# Patient Record
Sex: Female | Born: 1950 | Race: White | Hispanic: No | Marital: Married | Smoking: Former smoker
Health system: Southern US, Community
[De-identification: ages and names within clinical notes are randomized; demographics above are authoritative.]

## PROBLEM LIST (undated history)

## (undated) DIAGNOSIS — J42 Unspecified chronic bronchitis: Secondary | ICD-10-CM

## (undated) DIAGNOSIS — E079 Disorder of thyroid, unspecified: Secondary | ICD-10-CM

## (undated) DIAGNOSIS — K219 Gastro-esophageal reflux disease without esophagitis: Secondary | ICD-10-CM

## (undated) DIAGNOSIS — E785 Hyperlipidemia, unspecified: Secondary | ICD-10-CM

## (undated) DIAGNOSIS — F32A Depression, unspecified: Secondary | ICD-10-CM

## (undated) HISTORY — DX: Gastro-esophageal reflux disease without esophagitis: K21.9

## (undated) HISTORY — DX: Disorder of thyroid, unspecified: E07.9

## (undated) HISTORY — DX: Hyperlipidemia, unspecified: E78.5

## (undated) HISTORY — DX: Unspecified chronic bronchitis: J42

## (undated) HISTORY — DX: Depression, unspecified: F32.A

---

## 1998-10-05 ENCOUNTER — Other Ambulatory Visit: Admission: RE | Admit: 1998-10-05 | Discharge: 1998-10-05 | Payer: Self-pay | Admitting: Obstetrics and Gynecology

## 1999-11-30 ENCOUNTER — Other Ambulatory Visit: Admission: RE | Admit: 1999-11-30 | Discharge: 1999-11-30 | Payer: Self-pay | Admitting: Obstetrics and Gynecology

## 2000-12-19 ENCOUNTER — Other Ambulatory Visit: Admission: RE | Admit: 2000-12-19 | Discharge: 2000-12-19 | Payer: Self-pay | Admitting: Obstetrics and Gynecology

## 2002-01-27 ENCOUNTER — Other Ambulatory Visit: Admission: RE | Admit: 2002-01-27 | Discharge: 2002-01-27 | Payer: Self-pay | Admitting: Obstetrics and Gynecology

## 2003-03-23 ENCOUNTER — Other Ambulatory Visit: Admission: RE | Admit: 2003-03-23 | Discharge: 2003-03-23 | Payer: Self-pay | Admitting: Obstetrics and Gynecology

## 2006-01-09 ENCOUNTER — Encounter: Admission: RE | Admit: 2006-01-09 | Discharge: 2006-01-09 | Payer: Self-pay | Admitting: Family Medicine

## 2008-12-22 ENCOUNTER — Emergency Department (HOSPITAL_COMMUNITY): Admission: EM | Admit: 2008-12-22 | Discharge: 2008-12-22 | Payer: Self-pay | Admitting: Emergency Medicine

## 2010-05-07 ENCOUNTER — Ambulatory Visit
Admission: RE | Admit: 2010-05-07 | Discharge: 2010-05-07 | Disposition: A | Discharge status: Home / Self Care | Payer: 59 | Source: Ambulatory Visit | Attending: Family Medicine | Admitting: Family Medicine

## 2010-05-07 ENCOUNTER — Other Ambulatory Visit: Payer: Self-pay | Admitting: Family Medicine

## 2010-05-07 DIAGNOSIS — R0602 Shortness of breath: Secondary | ICD-10-CM

## 2010-05-16 ENCOUNTER — Encounter: Payer: Self-pay | Admitting: Internal Medicine

## 2010-05-16 ENCOUNTER — Encounter (INDEPENDENT_AMBULATORY_CARE_PROVIDER_SITE_OTHER): Payer: 59

## 2010-05-16 DIAGNOSIS — J449 Chronic obstructive pulmonary disease, unspecified: Secondary | ICD-10-CM

## 2010-05-18 ENCOUNTER — Encounter: Payer: Self-pay | Admitting: Internal Medicine

## 2010-05-24 NOTE — Assessment & Plan Note (Signed)
Summary: PFT FOR COPD//SH    Other Orders: Carbon Monoxide diffusing w/capacity (04540) Lung Volumes/Gas dilution or washout (98119) Spirometry (Pre & Post) [phone])

## 2012-04-10 ENCOUNTER — Telehealth: Payer: Self-pay | Admitting: *Deleted

## 2016-03-12 ENCOUNTER — Other Ambulatory Visit (HOSPITAL_COMMUNITY)
Admission: RE | Admit: 2016-03-12 | Discharge: 2016-03-12 | Disposition: A | Discharge status: Home / Self Care | Payer: 59 | Source: Ambulatory Visit | Attending: Family Medicine | Admitting: Family Medicine

## 2016-03-12 ENCOUNTER — Other Ambulatory Visit: Payer: Self-pay | Admitting: Family Medicine

## 2016-03-12 DIAGNOSIS — Z1151 Encounter for screening for human papillomavirus (HPV): Secondary | ICD-10-CM | POA: Diagnosis not present

## 2016-03-12 DIAGNOSIS — Z01411 Encounter for gynecological examination (general) (routine) with abnormal findings: Secondary | ICD-10-CM | POA: Insufficient documentation

## 2016-03-14 LAB — CYTOLOGY - PAP
Diagnosis: NEGATIVE
HPV: NOT DETECTED

## 2016-06-13 ENCOUNTER — Other Ambulatory Visit: Payer: Self-pay | Admitting: Family Medicine

## 2016-06-13 DIAGNOSIS — M5431 Sciatica, right side: Secondary | ICD-10-CM

## 2016-07-03 ENCOUNTER — Ambulatory Visit
Admission: RE | Admit: 2016-07-03 | Discharge: 2016-07-03 | Disposition: A | Discharge status: Home / Self Care | Payer: 59 | Source: Ambulatory Visit | Attending: Family Medicine | Admitting: Family Medicine

## 2016-07-03 DIAGNOSIS — M5431 Sciatica, right side: Secondary | ICD-10-CM

## 2017-04-02 ENCOUNTER — Other Ambulatory Visit: Payer: Self-pay | Admitting: Family Medicine

## 2017-04-02 ENCOUNTER — Ambulatory Visit
Admission: RE | Admit: 2017-04-02 | Discharge: 2017-04-02 | Disposition: A | Discharge status: Home / Self Care | Payer: 59 | Source: Ambulatory Visit | Attending: Family Medicine | Admitting: Family Medicine

## 2017-04-02 DIAGNOSIS — R0602 Shortness of breath: Secondary | ICD-10-CM

## 2017-04-23 ENCOUNTER — Other Ambulatory Visit: Payer: Self-pay | Admitting: Family Medicine

## 2017-04-23 DIAGNOSIS — R9389 Abnormal findings on diagnostic imaging of other specified body structures: Secondary | ICD-10-CM

## 2017-04-25 ENCOUNTER — Ambulatory Visit
Admission: RE | Admit: 2017-04-25 | Discharge: 2017-04-25 | Disposition: A | Discharge status: Home / Self Care | Payer: 59 | Source: Ambulatory Visit | Attending: Family Medicine | Admitting: Family Medicine

## 2017-04-25 DIAGNOSIS — R9389 Abnormal findings on diagnostic imaging of other specified body structures: Secondary | ICD-10-CM

## 2017-04-25 MED ORDER — IOPAMIDOL (ISOVUE-300) INJECTION 61%
75.0000 mL | Freq: Once | INTRAVENOUS | Status: AC | PRN
  Administered 2017-04-25: 75 mL via INTRAVENOUS

## 2017-05-15 ENCOUNTER — Encounter: Payer: Self-pay | Admitting: Internal Medicine

## 2017-05-15 ENCOUNTER — Ambulatory Visit (INDEPENDENT_AMBULATORY_CARE_PROVIDER_SITE_OTHER): Payer: 59 | Admitting: Internal Medicine

## 2017-05-15 VITALS — BP 118/74 | HR 86 | Ht 65.0 in | Wt 117.8 lb

## 2017-05-15 DIAGNOSIS — J449 Chronic obstructive pulmonary disease, unspecified: Secondary | ICD-10-CM

## 2017-05-15 DIAGNOSIS — R918 Other nonspecific abnormal finding of lung field: Secondary | ICD-10-CM | POA: Diagnosis not present

## 2017-05-15 DIAGNOSIS — J9819 Other pulmonary collapse: Secondary | ICD-10-CM | POA: Insufficient documentation

## 2017-05-15 NOTE — Progress Notes (Signed)
Subjective:    Patient ID: Jeanette Rivera, female    DOB: 04/07/1950,    MRN: [id number]  HPI  45 yowf quit smoking 03/2017 with tendency for uri's to turn into bronchitis typically each winter with onset of typical spell late Dec 2018 > abn cxr 04/02/17 then 2 rounds of abx with resolution of symptoms but persistent changes on CT 04/25/17 so referred to pulmonary clinic 05/15/2017 by Dr  Mila Palmer.    05/15/2017 1st [facility] office visit/ Wert   Chief Complaint  Patient presents with  . Pulmonary Consult    Dr. Paulino Rily referred for pneumonia, no SOb or cough at this time, on Symbicort and doesnt use rescue inhaler much, hoarseness   At present  Not limited by breathing from desired activities  / no need for  inhalers/ no pfts on record No cough   No obvious day to day or daytime variability or assoc excess/ purulent sputum or mucus plugs or hemoptysis or cp or chest tightness, subjective wheeze or overt sinus or hb symptoms. No unusual exposure hx or h/o childhood pna/ asthma or knowledge of premature birth.  Sleeping ok flat without nocturnal  or early am exacerbation  of respiratory  c/o's or need for noct saba. Also denies any obvious fluctuation of symptoms with weather or environmental changes or other aggravating or alleviating factors except as outlined above   Current Allergies, Complete Past Medical History, Past Surgical History, Family History, and Social History were reviewed in [employer] record.     Current Meds  Medication Sig  . albuterol (PROVENTIL HFA;VENTOLIN HFA) 108 (90 Base) MCG/ACT inhaler Inhale into the lungs every 6 (six) hours as needed for wheezing or shortness of breath.  . ALPRAZolam (XANAX) 0.25 MG tablet Take 0.25 mg by mouth 2 (two) times daily as needed for anxiety.  . budesonide-formoterol (SYMBICORT) 80-4.5 MCG/ACT inhaler Inhale 2 puffs into the lungs 2 (two) times daily.  . Multiple Vitamin (MULTIVITAMIN) tablet  Take 1 tablet by mouth daily.  Marland Kitchen omeprazole (PRILOSEC) 40 MG capsule Take 40 mg by mouth daily.            Review of Systems  Constitutional: Positive for unexpected weight change. Negative for fever.  HENT: Negative for congestion, dental problem, ear pain, nosebleeds, postnasal drip, rhinorrhea, sinus pressure, sneezing, sore throat and trouble swallowing.   Eyes: Negative for redness and itching.  Respiratory: Positive for cough. Negative for chest tightness, shortness of breath and wheezing.   Cardiovascular: Negative for palpitations and leg swelling.  Gastrointestinal: Negative for nausea and vomiting.  Genitourinary: Negative for dysuria.  Musculoskeletal: Negative for joint swelling.  Skin: Negative for rash.  Neurological: Negative for headaches.  Hematological: Does not bruise/bleed easily.  Psychiatric/Behavioral: Negative for dysphoric mood. The patient is not nervous/anxious.        Objective:   Physical Exam  amb wf nad  Wt Readings from Last 3 Encounters:  05/15/17 117 lb 12.8 oz (53.4 kg)     Vital signs reviewed - Note on arrival 02 sats  98% on   RA   HEENT: nl dentition, turbinates bilaterally, and oropharynx. Nl external ear canals without cough reflex   NECK :  without JVD/Nodes/TM/ nl carotid upstrokes bilaterally   LUNGS: no acc muscle use,  Nl contour chest which is clear to A and P bilaterally without cough on insp or exp maneuvers   CV:  RRR  no s3 or murmur or increase in P2, and  no edema   ABD:  soft and nontender with nl inspiratory excursion in the supine position. No bruits or organomegaly appreciated, bowel sounds nl  MS:  Nl gait/ ext warm without deformities, calf tenderness, cyanosis or clubbing No obvious joint restrictions   SKIN: warm and dry without lesions    NEURO:  alert, approp, nl sensorium with  no motor or cerebellar deficits apparent.     I personally reviewed images and agree with radiology impression as  follows:   Chest CT 04/25/17  1. Persistent irregular opacity in portions of the inferior aspect of the right middle lobe medially. While some or potentially all of this area may represent scarring, the appearance is concerning for a degree of airspace consolidation/pneumonia in this area. No cavitation is noted in this area. This area warrants either additional 3-4 week radiographic surveillance with consideration for bronchoscopy if there is no change in this area or bronchoscopy sooner if there is high clinical suspicion for pneumonia in this area. No obstructing focus is noted in peripheral bronchial structures in this area.  2. Underlying centrilobular emphysema with scattered areas of scarring. Several nodular opacities noted, largest measuring 5 mm. No follow-up needed if patient is low-risk (and has no known or suspected primary neoplasm). Non-contrast chest CT can be considered in 12 months if patient is high-risk.      Assessment & Plan:

## 2017-05-15 NOTE — Assessment & Plan Note (Addendum)
Quit smoking 03/2017 > needs baseline pfts at f/u ov    I reviewed the Fletcher curve with the patient that basically indicates  if you quit smoking when your best day FEV1 is still well preserved (as is clearly  the case here)  it is highly unlikely you will progress to severe disease and informed the patient there was  no medication on the market that has proven to alter the curve/ its downward trajectory  or the likelihood of progression of their disease(unlike other chronic medical conditions such as atheroclerosis where we do think we can change the natural hx with risk reducing meds)    Therefore stopping smoking and maintaining abstinence are  the most important aspects of care, not choice of inhalers or for that matter, doctors.   Treatment other than smoking cessation  is entirely directed by severity of symptoms and focused also on reducing exacerbations, not attempting to change the natural history of the disease.    As having no more symptoms needs no rx for now

## 2017-05-15 NOTE — Assessment & Plan Note (Signed)
See study 04/25/17  Largest 5 mm > placed in tickle file for recall 04/25/18  As she is at risk (quit smoking 03/2017)   CT results reviewed with pt >>> Too small for PET or bx, not suspicious enough for excisional bx > really only option for now is follow the Fleischner society guidelines as rec by radiology.    Discussed in detail all the  indications, usual  risks and alternatives  relative to the benefits with patient who agrees to proceed with conservative f/u as outlined

## 2017-05-15 NOTE — Patient Instructions (Addendum)
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

## 2017-05-15 NOTE — Assessment & Plan Note (Signed)
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 because of the poor collateral aeration of the RML wedged between the upper and lower with a fish mouth orifice that is prone to mucus plugging esp in pts with poor mc function from smoking  Other than maintaining cig abstinence, all she needs is f/u cxr in 4-6 weeks as the abn is easily seen on lateral cxr and the 5 mm mpns can be addressed otherwise (see separate a/p)    Total time devoted to counseling  > 50 % of initial 60 min office visit:  review case with pt/ discussion of options/alternatives/ personally creating written customized instructions  in presence of pt  then going over those specific  Instructions directly with the pt including how to use all of the meds but in particular covering each new medication in detail and the difference between the maintenance= "automatic" meds and the prns using an action plan format for the latter (If this problem/symptom => do that organization reading Left to right).  Please see AVS from this visit for a full list of these instructions which I personally wrote for this pt and  are unique to this visit.

## 2017-06-27 ENCOUNTER — Ambulatory Visit (INDEPENDENT_AMBULATORY_CARE_PROVIDER_SITE_OTHER): Payer: 59 | Admitting: Internal Medicine

## 2017-06-27 ENCOUNTER — Ambulatory Visit (INDEPENDENT_AMBULATORY_CARE_PROVIDER_SITE_OTHER)
Admission: RE | Admit: 2017-06-27 | Discharge: 2017-06-27 | Disposition: A | Discharge status: Home / Self Care | Payer: 59 | Source: Ambulatory Visit | Attending: Internal Medicine | Admitting: Internal Medicine

## 2017-06-27 ENCOUNTER — Encounter: Payer: Self-pay | Admitting: Internal Medicine

## 2017-06-27 VITALS — BP 106/70 | HR 68 | Ht 66.0 in | Wt 126.0 lb

## 2017-06-27 DIAGNOSIS — J449 Chronic obstructive pulmonary disease, unspecified: Secondary | ICD-10-CM | POA: Diagnosis not present

## 2017-06-27 DIAGNOSIS — J9819 Other pulmonary collapse: Secondary | ICD-10-CM

## 2017-06-27 LAB — PULMONARY FUNCTION TEST
DL/VA % PRED: 58 %
DL/VA: 2.94 ml/min/mmHg/L
DLCO UNC % PRED: 48 %
DLCO UNC: 13.19 ml/min/mmHg
FEF 25-75 POST: 0.85 L/s
FEF 25-75 PRE: 0.64 L/s
FEF2575-%CHANGE-POST: 31 %
FEF2575-%PRED-POST: 38 %
FEF2575-%PRED-PRE: 29 %
FEV1-%Change-Post: 10 %
FEV1-%Pred-Post: 61 %
FEV1-%Pred-Pre: 55 %
FEV1-POST: 1.57 L
FEV1-Pre: 1.43 L
FEV1FVC-%CHANGE-POST: 6 %
FEV1FVC-%PRED-PRE: 71 %
FEV6-%CHANGE-POST: 4 %
FEV6-%PRED-POST: 82 %
FEV6-%Pred-Pre: 78 %
FEV6-PRE: 2.55 L
FEV6-Post: 2.65 L
FEV6FVC-%CHANGE-POST: 0 %
FEV6FVC-%PRED-PRE: 102 %
FEV6FVC-%Pred-Post: 103 %
FVC-%CHANGE-POST: 3 %
FVC-%PRED-POST: 79 %
FVC-%Pred-Pre: 76 %
FVC-Post: 2.67 L
FVC-Pre: 2.58 L
POST FEV1/FVC RATIO: 59 %
PRE FEV1/FVC RATIO: 55 %
Post FEV6/FVC ratio: 99 %
Pre FEV6/FVC Ratio: 99 %
RV % pred: 107 %
RV: 2.37 L
TLC % pred: 98 %
TLC: 5.27 L

## 2017-06-27 MED ORDER — CLOTRIMAZOLE 10 MG MT TROC: 10.0000 mg | Freq: Every day | OROMUCOSAL | 2 refills | Status: DC

## 2017-06-27 MED ORDER — BUDESONIDE-FORMOTEROL FUMARATE 80-4.5 MCG/ACT IN AERO: INHALATION_SPRAY | RESPIRATORY_TRACT | 11 refills | Status: AC

## 2017-06-27 NOTE — Assessment & Plan Note (Signed)
-   PFT's   05/16/10  FEV1 1.77 (72 % ) ratio 55  p 10 % improvement from saba p ? prior to study with DLCO  61 % corrects to 80 % for alv volume   -Quit smoking 03/2017  - PFT's  06/27/2017  FEV1 1.57 (61 % ) ratio 59  p 10 % improvement from saba p sym 160 prior to study with DLCO  48 % corrects to 58 % for alv volume    Has developed thrush on the 160 and doing great so rec try the 80 2bid    I reviewed the Fletcher curve with the patient that basically indicates  if you quit smoking when your best day FEV1 is still relatively well preserved (as is still the case here)  it is highly unlikely you will progress to severe disease and informed the patient there was  no medication on the market that has proven to alter the curve/ its downward trajectory  or the likelihood of progression of their disease(unlike other chronic medical conditions such as atheroclerosis where we do think we can change the natural hx with risk reducing meds)    Therefore stopping smoking and maintaining abstinence are  the most important aspects of care, not choice of inhalers or for that matter, doctors.   Treatment other than smoking cessation  is entirely directed by severity of symptoms and focused also on reducing exacerbations, not attempting to change the natural history of the disease.   As long as not having exac or limiting symptoms will try to maintain on lower dose of symb and f/u in 3 months    I had an extended discussion with the patient reviewing all relevant studies completed to date and  lasting 15 to 20 minutes of a 25 minute visit    Each maintenance medication was reviewed in detail including most importantly the difference between maintenance and prns and under what circumstances the prns are to be triggered using an action plan format that is not reflected in the computer generated alphabetically organized AVS.    Please see AVS for specific instructions unique to this visit that I personally wrote and  verbalized to the the pt in detail and then reviewed with pt  by my nurse highlighting any  changes in therapy recommended at today's visit to their plan of care.

## 2017-06-27 NOTE — Progress Notes (Signed)
Patient completed full PFT today. 

## 2017-06-27 NOTE — Progress Notes (Signed)
Left detailed msg with results

## 2017-06-27 NOTE — Progress Notes (Signed)
Subjective:   Patient ID: Jeanette Rivera, female    DOB: 09-17-1950  MRN: [id number]    Brief patient profile:  [age] yowf quit smoking 03/2017 with tendency for uri's to turn into bronchitis typically each winter with onset of typical spell late Dec 2018 > abn cxr 04/02/17 then 2 rounds of abx with resolution of symptoms but persistent changes on CT 04/25/17 so referred to pulmonary clinic 05/15/2017 by Dr  Mila PalmerSharon Wolters with GOLD II criteria 06/27/2017      History of Present Illness  05/15/2017 1st [facility] office visit/ Majorie Santee   Chief Complaint  Patient presents with  . Pulmonary Consult    Dr. 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 Follow-up    PFT's done today. She states voice hoarseness comes and goes. She has not had to use her albuterol inhaler.    doe = Not limited by breathing from desired activities   Cough > none  Sleep: ok SABA use:  No need for rescue    No obvious day to day or daytime variability or assoc excess/ purulent sputum or mucus plugs or hemoptysis or cp or chest tightness, subjective wheeze or overt sinus or hb symptoms. No unusual exposure hx or h/o childhood pna/ asthma or knowledge of premature birth.  Sleeping  Ok flat  without nocturnal  or early am exacerbation  of respiratory  c/o's or need for noct saba. Also denies any obvious fluctuation of symptoms with weather or environmental changes or other aggravating or alleviating factors except as outlined  above   Current Allergies, Complete Past Medical History, Past Surgical History, Family History, and Social History were reviewed in [facility] electronic [facility] record.  ROS  The following are not active complaints unless bolded Hoarseness, sore throat, dysphagia, dental problems, itching, sneezing,  nasal congestion or discharge of excess mucus or purulent secretions, ear ache,   fever, chills, sweats, unintended wt loss or wt gain, classically pleuritic or exertional cp,  orthopnea pnd or arm/hand swelling  or leg swelling, presyncope, palpitations, abdominal pain, anorexia, nausea, vomiting, diarrhea  or change in bowel habits or change in bladder habits, change in stools or change in urine, dysuria, hematuria,  rash, arthralgias, visual complaints, headache, numbness, weakness or ataxia or problems with walking or coordination,  change in mood or  memory.        Current Meds  Medication Sig  . albuterol (PROVENTIL HFA;VENTOLIN HFA) 108 (90 Base) MCG/ACT inhaler Inhale into the lungs every 6 (six) hours as needed for wheezing or shortness of breath.  . ALPRAZolam (XANAX) 0.25 MG tablet Take 0.25 mg by mouth 2 (two) times daily as needed for anxiety.  . budesonide-formoterol (SYMBICORT) 80-4.5 MCG/ACT inhaler Inhale 2 puffs into the lungs 2 (two) times daily.  . Multiple Vitamin (MULTIVITAMIN) tablet Take 1 tablet by mouth daily.  Marland Kitchen. omeprazole (PRILOSEC) 40 MG capsule Take 40 mg by mouth daily.  Objective:   Physical Exam  amb wf nad  Wt Readings from Last 3 Encounters:  05/15/17 117 lb 12.8 oz (53.4 kg)     Vital signs reviewed - Note on arrival 02 sats  97% on RA       HEENT: nl dentition, turbinates bilaterally, and oropharynx. Nl external ear canals without cough reflex   NECK :  without JVD/Nodes/TM/ nl carotid upstrokes bilaterally   LUNGS: no acc muscle use,  Nl contour chest which is clear to A and P bilaterally without cough on insp or exp  maneuvers   CV:  RRR  no s3 or murmur or increase in P2, and no edema   ABD:  soft and nontender with nl inspiratory excursion in the supine position. No bruits or organomegaly appreciated, bowel sounds nl  MS:  Nl gait/ ext warm without deformities, calf tenderness, cyanosis or clubbing No obvious joint restrictions   SKIN: warm and dry without lesions    NEURO:  alert, approp, nl sensorium with  no motor or cerebellar deficits apparent.        CXR PA and Lateral:   06/27/2017 :    I personally reviewed images and agree with radiology impression as follows:   Interval clearing of right base infiltrate.      Assessment & Plan:

## 2017-06-27 NOTE — Patient Instructions (Addendum)
Clotrimazole troche up to 5 x daily   Try reduce the symbicort  80 Take 2 puffs first thing in am and then another 2 puffs about 12 hours later.   Work on inhaler technique:  relax and gently blow all the way out then take a nice smooth deep breath back in, triggering the inhaler at same time you start breathing in.  Hold for up to 5 seconds if you can. Blow out thru nose. Rinse and gargle with water when done  Please remember to go to the  x-ray department downstairs in the basement  for your tests - we will call you with the results when they are available.       Please schedule a follow up visit in 3 months but call sooner if needed

## 2017-08-28 ENCOUNTER — Encounter: Payer: Self-pay | Admitting: Internal Medicine

## 2017-08-28 NOTE — Assessment & Plan Note (Signed)
cxr reviewed and is clear

## 2017-09-26 ENCOUNTER — Encounter: Payer: Self-pay | Admitting: Internal Medicine

## 2017-09-26 ENCOUNTER — Ambulatory Visit (INDEPENDENT_AMBULATORY_CARE_PROVIDER_SITE_OTHER): Payer: 59 | Admitting: Internal Medicine

## 2017-09-26 VITALS — BP 114/76 | HR 76 | Ht 66.0 in | Wt 140.8 lb

## 2017-09-26 DIAGNOSIS — R918 Other nonspecific abnormal finding of lung field: Secondary | ICD-10-CM | POA: Diagnosis not present

## 2017-09-26 DIAGNOSIS — J449 Chronic obstructive pulmonary disease, unspecified: Secondary | ICD-10-CM | POA: Diagnosis not present

## 2017-09-26 NOTE — Progress Notes (Signed)
Subjective:   Patient ID: Jeanette Rivera, female    DOB: Dec 14, 1950  MRN: [id number]    Brief patient profile:  83 yowf quit smoking 03/2017 with tendency for uri's to turn into bronchitis typically each winter with onset of typical spell late Dec 2018 > abn cxr 04/02/17 then 2 rounds of abx with resolution of symptoms but persistent changes on CT 04/25/17 so referred to pulmonary clinic 05/15/2017 by Dr  Mila Palmer with GOLD II criteria 06/27/2017      History of Present Illness  05/15/2017 1st [facility] office visit/ Briceyda Abdullah   Chief Complaint  Patient presents with  . Pulmonary Consult    Dr. 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 Follow-up    PFT's done today. She states voice hoarseness comes and goes. She has not had to use her albuterol inhaler.    doe = Not limited by breathing from desired activities   Cough > none  Sleep: ok SABA use:  No need for rescue  rec Clotrimazole troche up to 5 x daily  Try reduce the symbicort  80 Take 2 puffs first thing in am and then another 2 puffs about 12 hours later.  Work on inhaler technique:      09/26/2017  f/u ov/Braelynne Garinger re:  GOLD II copd/ symb 80 2bid  Chief Complaint  Patient presents with  . Follow-up    Breathing is unchanged. She is coughing less. She states has not had to use her albuterol inhaler. She states currently on pred and amoxicllin per ENT for fluid in her left ear.    acute sore throat/ chest tight no cough end of  June 2019 while in [place]  Rx zpak then amoxic/prednisone starting 09/23/17  Dyspnea:  Not limited by breathing from desired activities   Cough: much better    SABA use: none while sym 80 2bid 02: none    No obvious day to day or daytime variability or assoc excess/ purulent sputum or mucus plugs or hemoptysis or cp or chest tightness, subjective wheeze or overt sinus or hb symptoms.   Sleeping: fine flat without nocturnal  or early am exacerbation  of respiratory  c/o's or need for noct saba. Also denies any obvious fluctuation of symptoms with weather or environmental changes or other aggravating or alleviating factors except as outlined above   No unusual exposure hx or h/o childhood pna/ asthma or knowledge of premature birth.  Current Allergies, Complete Past Medical History, Past Surgical History, Family History, and Social History were reviewed in [employer] record.  ROS  The following are not active complaints unless bolded Hoarseness, sore throat, dysphagia, dental problems, itching, sneezing,  nasal congestion or discharge of excess mucus or purulent secretions, ear ache,   fever, chills, sweats, unintended wt loss or wt gain, classically pleuritic or exertional cp,  orthopnea pnd or arm/hand swelling  or leg swelling, presyncope, palpitations, abdominal pain, anorexia,  nausea, vomiting, diarrhea  or change in bowel habits or change in bladder habits, change in stools or change in urine, dysuria, hematuria,  rash, arthralgias, visual complaints, headache, numbness, weakness or ataxia or problems with walking or coordination,  change in mood or  memory.        Current Meds  Medication Sig  . albuterol (PROVENTIL HFA;VENTOLIN HFA) 108 (90 Base) MCG/ACT inhaler Inhale into the lungs every 6 (six) hours as needed for wheezing or shortness of breath.  . ALPRAZolam (XANAX) 0.25 MG tablet Take 0.25 mg by mouth 2 (two) times daily as needed for anxiety.  Marland Kitchen.  amoxicillin-clavulanate (AUGMENTIN) 875-125 MG tablet Take 1 tablet by mouth 2 (two) times daily.  . benzonatate (TESSALON) 200 MG capsule Take 1 capsule by mouth 3 (three) times daily as needed.  . budesonide-formoterol (SYMBICORT) 80-4.5 MCG/ACT inhaler Inhale 2 puffs into the lungs 2 (two) times daily.  . budesonide-formoterol (SYMBICORT) 80-4.5 MCG/ACT inhaler Take 2 puffs first thing in am and then another 2 puffs about 12 hours later.  . Multiple Vitamin (MULTIVITAMIN) tablet Take 1 tablet by mouth daily.  Marland Kitchen. omeprazole (PRILOSEC) 40 MG capsule Take 40 mg by mouth daily.  . predniSONE (STERAPRED UNI-PAK 21 TAB) 10 MG (21) TBPK tablet Taper as directed                    Objective:   Physical Exam  amb wf nad    Wt Readings from Last 3 Encounters:  09/26/17 140 lb 12.8 oz (63.9 kg)  06/27/17 126 lb (57.2 kg)  05/15/17 117 lb 12.8 oz (53.4 kg)     Vital signs reviewed - Note on arrival 02 sats  93% on RA   HEENT: nl dentition, turbinates bilaterally  Nl external ear canals without cough reflex Oropharynx ok x    slt thrush posterior soft palate / uvula    NECK :  without JVD/Nodes/TM/ nl carotid upstrokes bilaterally   LUNGS: no acc muscle use,  Nl contour chest which is clear to A and P bilaterally without cough on insp or exp maneuvers   CV:  RRR  no s3 or murmur or increase in P2, and no edema   ABD:  soft and nontender with nl inspiratory excursion in the supine position. No bruits or organomegaly appreciated, bowel sounds nl  MS:  Nl gait/ ext warm without deformities, calf tenderness, cyanosis or clubbing No obvious joint restrictions   SKIN: warm and dry without lesions    NEURO:  alert, approp, nl sensorium with  no motor or cerebellar deficits apparent.                  Assessment & Plan:

## 2017-09-26 NOTE — Patient Instructions (Signed)
Change symbicort to where you take it up to 2 puffs every 12 hours as needed for short of breath/ cough/ tightness   If mouth /throat bothers you [facility]    Please schedule a follow up visit in 6 months but call sooner if needed

## 2017-09-27 ENCOUNTER — Encounter: Payer: Self-pay | Admitting: Internal Medicine

## 2017-09-27 NOTE — Assessment & Plan Note (Signed)
See study 04/25/17  Largest 5 mm > placed in tickle file for recall 04/25/18  As she is at risk (quit smoking 03/2017)  Reminded we have her in reminder file for recall CT - call sooner if any new symptoms develop

## 2017-09-27 NOTE — Assessment & Plan Note (Signed)
-   PFT's   05/16/10  FEV1 1.77 (72 % ) ratio 55  p 10 % improvement from saba p ? prior to study with DLCO  61 % corrects to 80 % for alv volume   -Quit smoking 03/2017  - PFT's  06/27/2017  FEV1 1.57 (61 % ) ratio 59  p 10 % improvement from saba p sym 160 prior to study with DLCO  48 % corrects to 58 % for alv volume    - 09/26/2017  After extensive coaching inhaler device  effectiveness =    90% from a baseline of 75%   Despite suboptimal hfa and half the usual strength of symbicort she's doing great and if continues to be bothered by thrush options are:  1) change symb 80 to 2 bid prn as per mild asthma recs Based on two studies from NEJM  378; 20 p 1865 (2018) and 380 : p2020-30 (2019) in pts with mild asthma it is reasonable to use low dose symbicort eg 80 2bid "prn" flare in this setting but I emphasized this was only shown with symbicort and takes advantage of the rapid onset of action but is not the same as "rescue therapy" but can be stopped once the acute symptoms have resolved and the need for rescue has been minimized (< 2 x weekly)    2) use spacer  3) change to bevespi 2 bid    She likes symb and wants to try symb 80 2bid prn first    I had an extended discussion with the patient reviewing all relevant studies completed to date and  lasting 15 to 20 minutes of a 25 minute visit    See device teaching which extended face to face time for this visit.  Each maintenance medication was reviewed in detail including emphasizing most importantly the difference between maintenance and prns and under what circumstances the prns are to be triggered using an action plan format that is not reflected in the computer generated alphabetically organized AVS which I have not found useful in most complex patients, especially with respiratory illnesses  Please see AVS for specific instructions unique to this visit that I personally wrote and verbalized to the the pt in detail and then reviewed with pt   by my nurse highlighting any  changes in therapy recommended at today's visit to their plan of care.      F/u can be q 6 m prn

## 2018-03-30 ENCOUNTER — Ambulatory Visit: Payer: 59 | Admitting: Internal Medicine

## 2019-01-28 ENCOUNTER — Other Ambulatory Visit: Payer: Self-pay | Admitting: Family Medicine

## 2019-01-28 DIAGNOSIS — E063 Autoimmune thyroiditis: Secondary | ICD-10-CM

## 2019-02-04 ENCOUNTER — Ambulatory Visit
Admission: RE | Admit: 2019-02-04 | Discharge: 2019-02-04 | Disposition: A | Discharge status: Home / Self Care | Payer: Medicare Other | Source: Ambulatory Visit | Attending: Family Medicine | Admitting: Family Medicine

## 2019-02-04 DIAGNOSIS — E063 Autoimmune thyroiditis: Secondary | ICD-10-CM

## 2019-05-13 ENCOUNTER — Ambulatory Visit: Payer: Medicare Other | Attending: Internal Medicine

## 2019-05-13 DIAGNOSIS — Z23 Encounter for immunization: Secondary | ICD-10-CM | POA: Insufficient documentation

## 2019-05-13 NOTE — Progress Notes (Signed)
   Covid-19 Vaccination Clinic  Name:  Jeanette Rivera    MRN: [id number] DOB: [birth year]  05/13/2019  [name] was observed post Covid-19 immunization for 15 minutes without incidence. She was provided with Vaccine Information Sheet and instruction to access the V-Safe system.   [name] was instructed to call 911 with any severe reactions post vaccine: Marland Kitchen Difficulty breathing  . Swelling of your face and throat  . A fast heartbeat  . A bad rash all over your body  . Dizziness and weakness    Immunizations Administered    Name Date Dose VIS Date Route   Pfizer COVID-19 Vaccine 05/13/2019 12:16 PM 0.3 mL 02/26/2019 Intramuscular   Manufacturer: [employer], [employer]   Lot: [id number]   NDC: [address]-0

## 2019-06-02 ENCOUNTER — Ambulatory Visit: Payer: Medicare Other | Attending: Internal Medicine

## 2019-06-02 DIAGNOSIS — Z23 Encounter for immunization: Secondary | ICD-10-CM

## 2019-06-02 NOTE — Progress Notes (Signed)
   Covid-19 Vaccination Clinic  Name:  Jeanette Rivera    MRN: [id number] DOB: [birth year]  06/02/2019  [name] was observed post Covid-19 immunization for 15 minutes without incident. She was provided with Vaccine Information Sheet and instruction to access the V-Safe system.   [name] was instructed to call 911 with any severe reactions post vaccine: Marland Kitchen Difficulty breathing  . Swelling of face and throat  . A fast heartbeat  . A bad rash all over body  . Dizziness and weakness   Immunizations Administered    Name Date Dose VIS Date Route   Pfizer COVID-19 Vaccine 06/02/2019 12:55 PM 0.3 mL 02/26/2019 Intramuscular   Manufacturer: [employer], [employer]   Lot: [id number]   NDC: [address]-8

## 2020-09-11 DIAGNOSIS — H43392 Other vitreous opacities, left eye: Secondary | ICD-10-CM | POA: Diagnosis not present

## 2020-10-16 DIAGNOSIS — H2513 Age-related nuclear cataract, bilateral: Secondary | ICD-10-CM | POA: Diagnosis not present

## 2020-10-16 DIAGNOSIS — H43812 Vitreous degeneration, left eye: Secondary | ICD-10-CM | POA: Diagnosis not present

## 2020-10-26 DIAGNOSIS — J449 Chronic obstructive pulmonary disease, unspecified: Secondary | ICD-10-CM | POA: Diagnosis not present

## 2020-10-26 DIAGNOSIS — F411 Generalized anxiety disorder: Secondary | ICD-10-CM | POA: Diagnosis not present

## 2020-10-26 DIAGNOSIS — E7800 Pure hypercholesterolemia, unspecified: Secondary | ICD-10-CM | POA: Diagnosis not present

## 2021-01-31 DIAGNOSIS — Z780 Asymptomatic menopausal state: Secondary | ICD-10-CM | POA: Diagnosis not present

## 2021-01-31 DIAGNOSIS — M8589 Other specified disorders of bone density and structure, multiple sites: Secondary | ICD-10-CM | POA: Diagnosis not present

## 2021-01-31 DIAGNOSIS — Z1231 Encounter for screening mammogram for malignant neoplasm of breast: Secondary | ICD-10-CM | POA: Diagnosis not present

## 2021-07-25 DIAGNOSIS — Z79899 Other long term (current) drug therapy: Secondary | ICD-10-CM | POA: Diagnosis not present

## 2021-07-25 DIAGNOSIS — F411 Generalized anxiety disorder: Secondary | ICD-10-CM | POA: Diagnosis not present

## 2021-07-25 DIAGNOSIS — I700 Atherosclerosis of aorta: Secondary | ICD-10-CM | POA: Diagnosis not present

## 2021-07-25 DIAGNOSIS — F330 Major depressive disorder, recurrent, mild: Secondary | ICD-10-CM | POA: Diagnosis not present

## 2021-07-25 DIAGNOSIS — E038 Other specified hypothyroidism: Secondary | ICD-10-CM | POA: Diagnosis not present

## 2021-07-25 DIAGNOSIS — J449 Chronic obstructive pulmonary disease, unspecified: Secondary | ICD-10-CM | POA: Diagnosis not present

## 2021-07-25 DIAGNOSIS — Z0000 Encounter for general adult medical examination without abnormal findings: Secondary | ICD-10-CM | POA: Diagnosis not present

## 2021-07-25 DIAGNOSIS — E7800 Pure hypercholesterolemia, unspecified: Secondary | ICD-10-CM | POA: Diagnosis not present

## 2021-07-25 DIAGNOSIS — E559 Vitamin D deficiency, unspecified: Secondary | ICD-10-CM | POA: Diagnosis not present

## 2021-07-26 DIAGNOSIS — Z1211 Encounter for screening for malignant neoplasm of colon: Secondary | ICD-10-CM | POA: Diagnosis not present

## 2021-08-04 IMAGING — US US THYROID
1 series · 13 of 25 positions shown · non-contrast
Comparison: None.

CLINICAL DATA: 67-year-old female with right thyroidectomy for
autoimmune thyroiditis

EXAM:
THYROID ULTRASOUND
TECHNIQUE: Ultrasound examination of the thyroid gland and adjacent soft
tissues was performed.

[Series 1: us thyroid · 0.06mm/px · 13 of 35 slices shown]
[im 1/35]
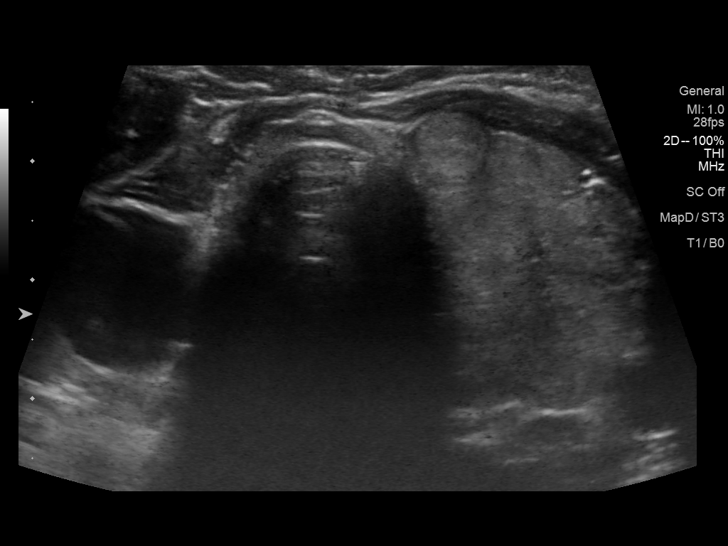
[im 3/35]
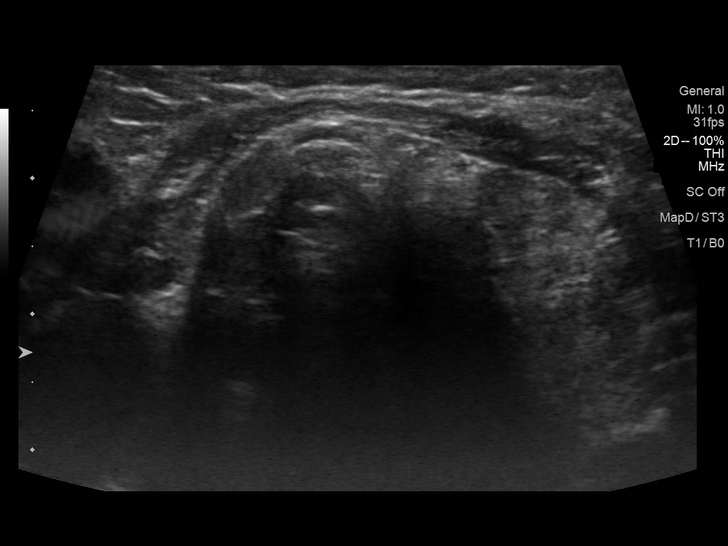
[im 6/35]
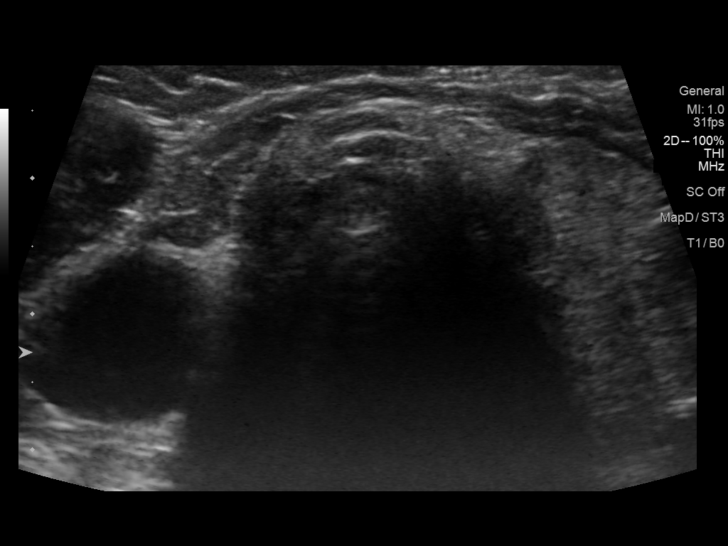
[im 9/35]
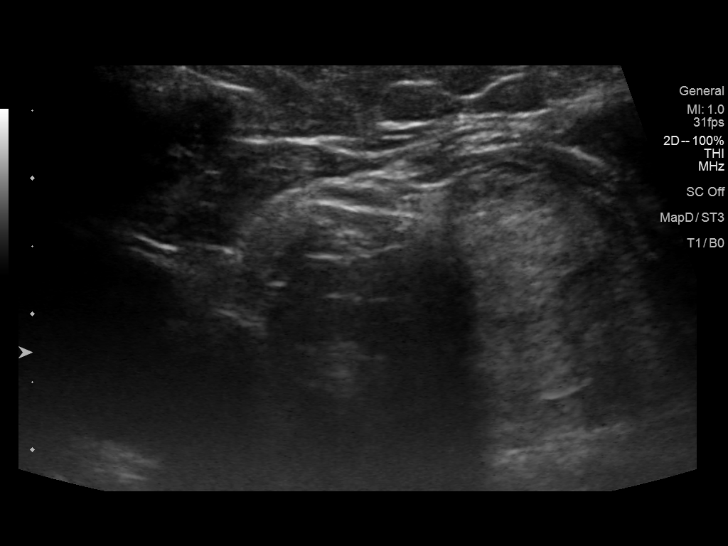
[im 12/35]
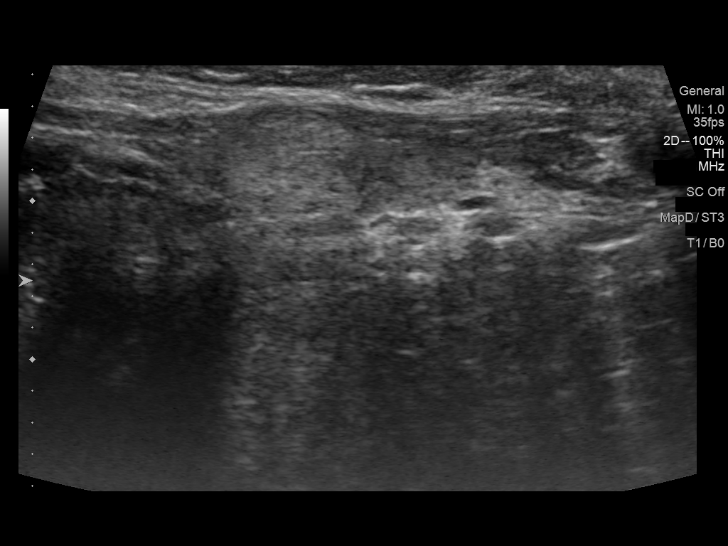
[im 15/35]
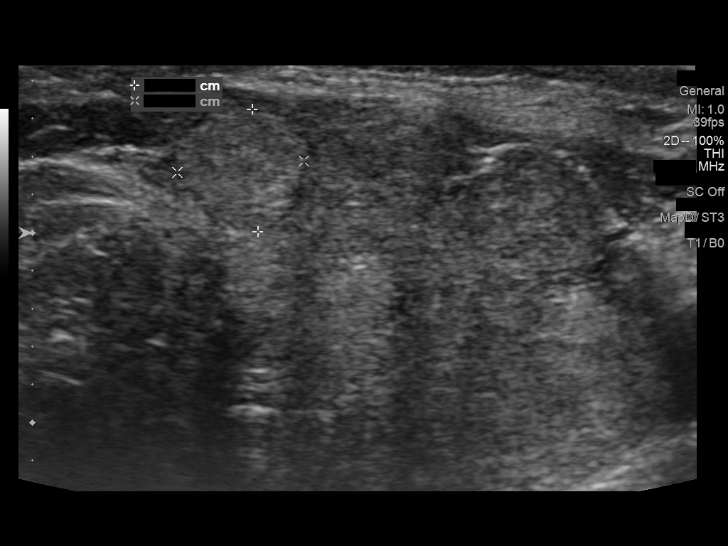
[im 18/35]
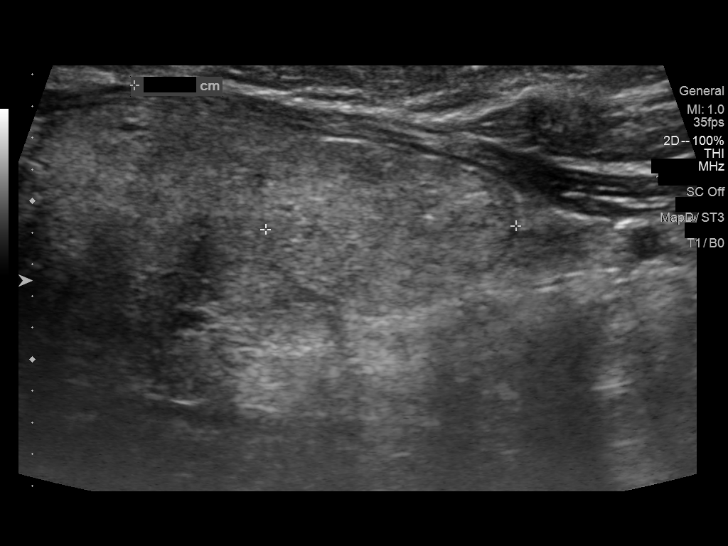
[im 20/35]
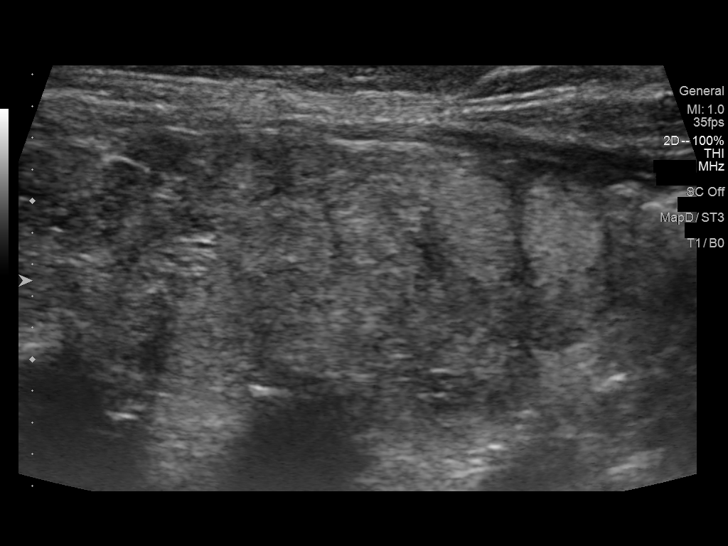
[im 23/35]
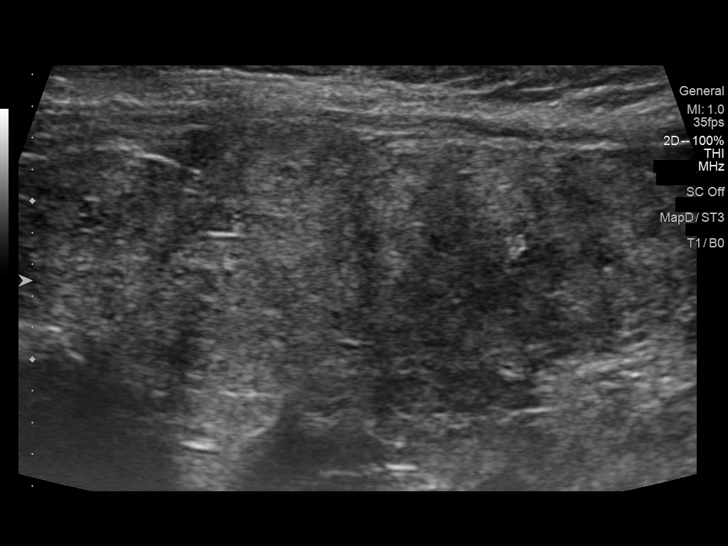
[im 26/35]
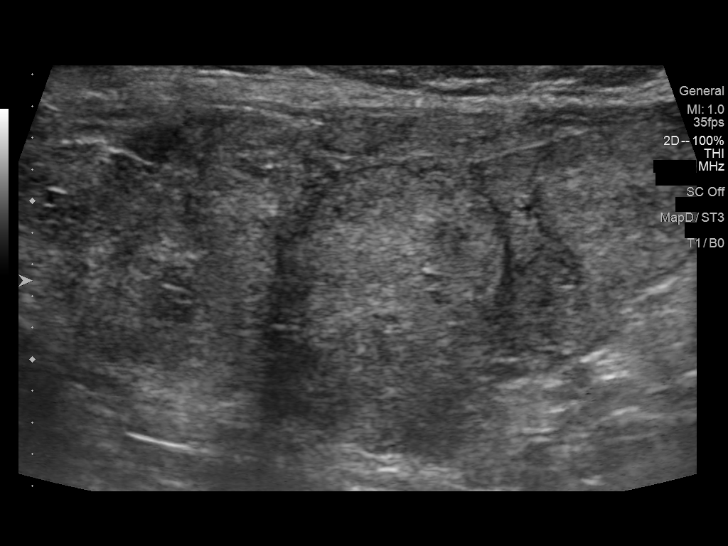
[im 29/35]
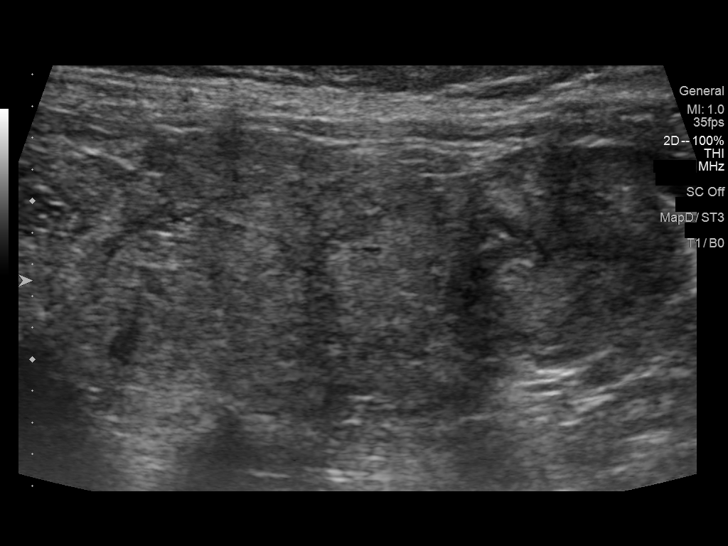
[im 32/35]
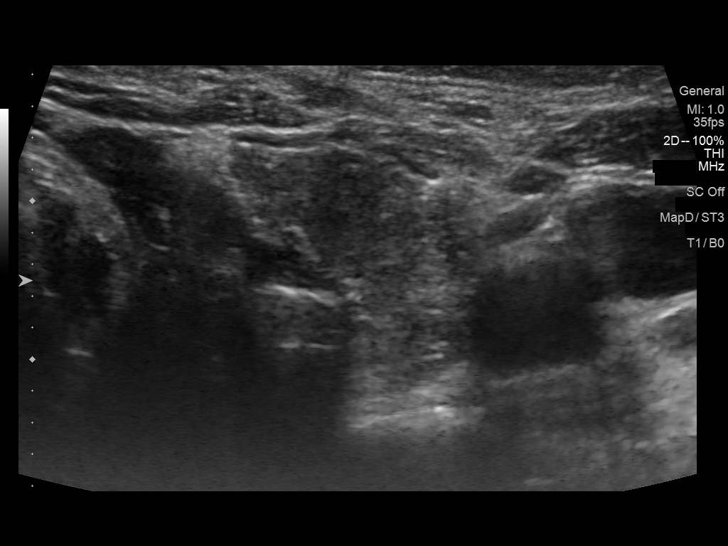
[im 35/35]
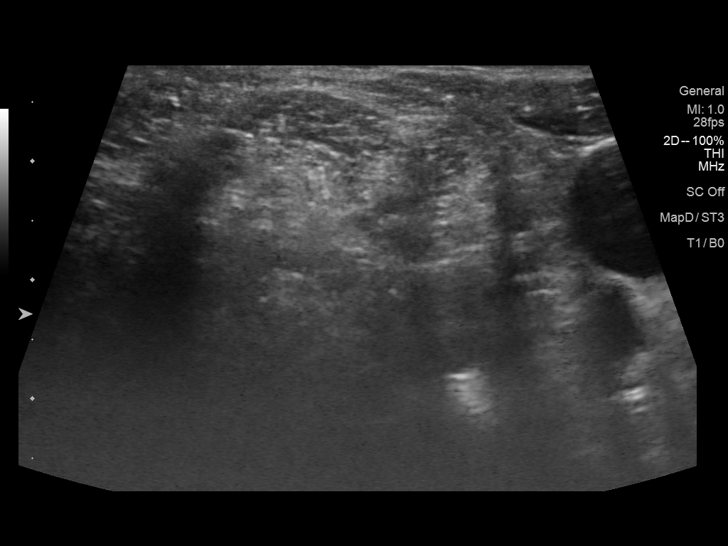

[13 of 25 positions shown; findings below may reference images not displayed]

FINDINGS: Parenchymal Echotexture: Moderately heterogenous

Isthmus: Surgical resect

Right lobe: Surgically resected

Left lobe: 5.7 cm x 2.2 cm x 1.8 cm

_________________________________________________________

Estimated total number of nodules >/= 1 cm: 2

Number of spongiform nodules >/=  2 cm not described below (TR1): 0

Number of mixed cystic and solid nodules >/= 1.5 cm not described
below (TR2): 0

_________________________________________________________

Nodule # 1:

Location: Left; Superior

Maximum size: 0.9 cm; Other 2 dimensions: 0.7 cm x 0.6 cm

Composition: solid/almost completely solid (2)

Echogenicity: isoechoic (1)

Shape: not taller-than-wide (0)

Margins: ill-defined (0)

Echogenic foci: none (0)

ACR TI-RADS total points: 3.

ACR TI-RADS risk category: TR3 (3 points).

ACR TI-RADS recommendations:

Nodule does not meet criteria for surveillance or biopsy

_________________________________________________________

Nodule # 2:

Location: Left; Mid

Maximum size: 1.4 cm; Other 2 dimensions: 1.3 cm x 1.1 cm

Composition: solid/almost completely solid (2)

Echogenicity: isoechoic (1)

Shape: not taller-than-wide (0)

Margins: ill-defined (0)

Echogenic foci: none (0)

ACR TI-RADS total points: 3.

ACR TI-RADS risk category: TR3 (3 points).

ACR TI-RADS recommendations:

Nodule does not meet criteria for surveillance or biopsy

_________________________________________________________

Nodule # 3:

Location: Left; Inferior

Maximum size: 1.6 cm; Other 2 dimensions: 1.3 cm x 1.1 cm

Composition: solid/almost completely solid (2)

Echogenicity: isoechoic (1)

Shape: not taller-than-wide (0)

Margins: ill-defined (0)

Echogenic foci: none (0)

ACR TI-RADS total points: 3.

ACR TI-RADS risk category: TR3 (3 points).

ACR TI-RADS recommendations:

Nodule meets criteria for surveillance

_________________________________________________________

No adenopathy
IMPRESSION: Right hemithyroidectomy.

Left inferior thyroid nodule (labeled 3) meets criteria for
surveillance, as designated by the newly established ACR TI-RADS
criteria. Surveillance ultrasound study recommended to be performed
annually up to 5 years.

Recommendations follow those established by the new ACR TI-RADS
criteria ([HOSPITAL] 0391;[DATE]).

## 2022-02-06 DIAGNOSIS — Z1231 Encounter for screening mammogram for malignant neoplasm of breast: Secondary | ICD-10-CM | POA: Diagnosis not present

## 2022-02-14 DIAGNOSIS — Z23 Encounter for immunization: Secondary | ICD-10-CM | POA: Diagnosis not present

## 2022-02-14 DIAGNOSIS — F330 Major depressive disorder, recurrent, mild: Secondary | ICD-10-CM | POA: Diagnosis not present

## 2022-04-15 DIAGNOSIS — H2513 Age-related nuclear cataract, bilateral: Secondary | ICD-10-CM | POA: Diagnosis not present

## 2022-08-02 DIAGNOSIS — E7800 Pure hypercholesterolemia, unspecified: Secondary | ICD-10-CM | POA: Diagnosis not present

## 2022-08-02 DIAGNOSIS — F330 Major depressive disorder, recurrent, mild: Secondary | ICD-10-CM | POA: Diagnosis not present

## 2022-08-02 DIAGNOSIS — E038 Other specified hypothyroidism: Secondary | ICD-10-CM | POA: Diagnosis not present

## 2022-08-02 DIAGNOSIS — F17211 Nicotine dependence, cigarettes, in remission: Secondary | ICD-10-CM | POA: Diagnosis not present

## 2022-08-02 DIAGNOSIS — J449 Chronic obstructive pulmonary disease, unspecified: Secondary | ICD-10-CM | POA: Diagnosis not present

## 2022-08-02 DIAGNOSIS — E559 Vitamin D deficiency, unspecified: Secondary | ICD-10-CM | POA: Diagnosis not present

## 2022-08-02 DIAGNOSIS — Z0000 Encounter for general adult medical examination without abnormal findings: Secondary | ICD-10-CM | POA: Diagnosis not present

## 2022-08-02 DIAGNOSIS — Z79899 Other long term (current) drug therapy: Secondary | ICD-10-CM | POA: Diagnosis not present

## 2022-08-02 DIAGNOSIS — I700 Atherosclerosis of aorta: Secondary | ICD-10-CM | POA: Diagnosis not present

## 2022-08-05 ENCOUNTER — Other Ambulatory Visit: Payer: Self-pay | Admitting: Family Medicine

## 2022-08-05 DIAGNOSIS — F17211 Nicotine dependence, cigarettes, in remission: Secondary | ICD-10-CM

## 2022-10-09 DIAGNOSIS — Z1211 Encounter for screening for malignant neoplasm of colon: Secondary | ICD-10-CM | POA: Diagnosis not present

## 2023-02-12 DIAGNOSIS — Z1231 Encounter for screening mammogram for malignant neoplasm of breast: Secondary | ICD-10-CM | POA: Diagnosis not present

## 2023-08-08 DIAGNOSIS — E038 Other specified hypothyroidism: Secondary | ICD-10-CM | POA: Diagnosis not present

## 2023-08-08 DIAGNOSIS — Z0000 Encounter for general adult medical examination without abnormal findings: Secondary | ICD-10-CM | POA: Diagnosis not present

## 2023-08-08 DIAGNOSIS — I700 Atherosclerosis of aorta: Secondary | ICD-10-CM | POA: Diagnosis not present

## 2023-08-08 DIAGNOSIS — Z1211 Encounter for screening for malignant neoplasm of colon: Secondary | ICD-10-CM | POA: Diagnosis not present

## 2023-08-08 DIAGNOSIS — F331 Major depressive disorder, recurrent, moderate: Secondary | ICD-10-CM | POA: Diagnosis not present

## 2023-08-08 DIAGNOSIS — Z79899 Other long term (current) drug therapy: Secondary | ICD-10-CM | POA: Diagnosis not present

## 2023-08-08 DIAGNOSIS — E7800 Pure hypercholesterolemia, unspecified: Secondary | ICD-10-CM | POA: Diagnosis not present

## 2023-08-08 DIAGNOSIS — E559 Vitamin D deficiency, unspecified: Secondary | ICD-10-CM | POA: Diagnosis not present

## 2023-08-08 DIAGNOSIS — J449 Chronic obstructive pulmonary disease, unspecified: Secondary | ICD-10-CM | POA: Diagnosis not present

## 2023-08-20 DIAGNOSIS — Z1211 Encounter for screening for malignant neoplasm of colon: Secondary | ICD-10-CM | POA: Diagnosis not present

## 2023-09-30 DIAGNOSIS — E042 Nontoxic multinodular goiter: Secondary | ICD-10-CM | POA: Diagnosis not present

## 2023-10-16 ENCOUNTER — Other Ambulatory Visit: Payer: Self-pay | Admitting: Family Medicine

## 2023-10-16 DIAGNOSIS — F17211 Nicotine dependence, cigarettes, in remission: Secondary | ICD-10-CM

## 2023-10-20 ENCOUNTER — Ambulatory Visit
Admission: RE | Admit: 2023-10-20 | Discharge: 2023-10-20 | Disposition: A | Discharge status: Home / Self Care | Source: Ambulatory Visit | Attending: Family Medicine | Admitting: Family Medicine

## 2023-10-20 DIAGNOSIS — Z87891 Personal history of nicotine dependence: Secondary | ICD-10-CM | POA: Diagnosis not present

## 2023-10-20 DIAGNOSIS — Z122 Encounter for screening for malignant neoplasm of respiratory organs: Secondary | ICD-10-CM | POA: Diagnosis not present

## 2023-10-20 DIAGNOSIS — F17211 Nicotine dependence, cigarettes, in remission: Secondary | ICD-10-CM

## 2023-11-05 DIAGNOSIS — E039 Hypothyroidism, unspecified: Secondary | ICD-10-CM | POA: Diagnosis not present

## 2024-01-08 ENCOUNTER — Ambulatory Visit: Payer: Self-pay

## 2024-01-08 NOTE — Telephone Encounter (Signed)
 Patient called back and reports think rash is from [place] soap.  Patient reports will try Claritin, otc.  Denies sob, blisters, fever, n/v or worse rash.  Scheduled new patient appt 04/15/23.  Advised UC/ED if symptoms worsen.

## 2024-01-08 NOTE — Telephone Encounter (Signed)
 FYI Only or Action Required?: FYI only for provider.  Patient was last seen in primary care on New Pt.  Called Nurse Triage reporting New Patient (Initial Visit), Medication Problem, and Rash.  Symptoms began several weeks ago.  Interventions attempted: Nothing.  Symptoms are: gradually worsening.  Triage Disposition: See Physician Within 24 Hours  Patient/caregiver understands and will follow disposition?: Yes         Copied from CRM #[id number]. Topic: Clinical - Red Word Triage >> Jan 08, 2024  2:40 PM Alfonso ORN wrote: Red Word that prompted transfer to Nurse Triage: pt stated that since rx dosage increase for thyroid , has broken out with itchy rash in areas that she sweats ( breasts, groin, etc)   Pt also wants to establish care with [facility] Reason for Disposition  Hives or itching    Pt initially calling to establish care with [facility] - Dr Catherine, but is experiencing itchy rash.    Upon further investigation, pt reports PCP has left practice and has not transitioned care with another provider yet, and she wanted to change practices all together. Triager advised pt to call PCP for an acute visit or go to UC for further evaluation of acute sx. Patient verbalized understanding.   Triager also advised New Pt appts with Dr Catherine is 03/2024. Pt will call back to schedule.  Answer Assessment - Initial Assessment Questions 1. NAME of MEDICINE: What medicine(s) are you calling about?     Levothyroixine 25 - reports took for 4-5 weeks. F/U with PCP and increased to 50.  2. QUESTION: What is your question? (e.g., double dose of medicine, side effect)     Side effects 3. PRESCRIBER: Who prescribed the medicine? Reason: if prescribed by specialist, call should be referred to that group.     PCP Dr Verena 4. SYMPTOMS: Do you have any symptoms? If Yes, ask: What symptoms are you having?  How bad are the symptoms (e.g., mild, moderate, severe)     Itchy rash underarms, breast,  groin, legs. 5. PREGNANCY:  Is there any chance that you are pregnant? When was your last menstrual period?     N/a  Answer Assessment - Initial Assessment Questions 1. APPEARANCE of RASH: What does the rash look like? (e.g., spots, blisters, raised areas, skin peeling, scaly)     Spots/patches 2. SIZE: How big are the spots? (e.g., tip of pen, eraser, coin; inches, centimeters)     Size of dime size 3. LOCATION: Where is the rash located?     Underarms, under breasts, groin, legs 4. COLOR: What color is the rash? (Note: It is difficult to assess rash color in people with darker-colored skin. When this situation occurs, simply ask the caller to describe what they see.)     Red patchy area 5. ONSET: When did the rash begin?     A few weeks ago 6. FEVER: Do you have a fever? If Yes, ask: What is your temperature, how was it measured, and when did it start?     denies 7. ITCHING: Does the rash itch? If Yes, ask: How bad is the itch? (Scale 1-10; or mild, moderate, severe)     yes 8. CAUSE: What do you think is causing the rash?     Initially thought r/t increased dosage of levothyroxine 9. NEW MEDICINES: What new medicines are you taking? (e.g., name of antibiotic) When did you start taking this medication?.     Levothyroxine 50 10. OTHER SYMPTOMS: Do you have  any other symptoms? (e.g., sore throat, fever, joint pain)       Denies. Does endorse SOB at baseline - on Breztri 11. PREGNANCY: Is there any chance you are pregnant? When was your last menstrual period?       N/a  Protocols used: Medication Question Call-A-AH, Rash - Widespread On Drugs-A-AH

## 2024-03-31 ENCOUNTER — Encounter: Payer: Self-pay | Admitting: Family Medicine

## 2024-04-14 ENCOUNTER — Ambulatory Visit: Admitting: Family Medicine

## 2024-06-15 ENCOUNTER — Ambulatory Visit: Admitting: Family Medicine
